# Patient Record
Sex: Female | Born: 1945 | Race: White | Hispanic: No | Marital: Married | Smoking: Never smoker
Health system: Southern US, Community
[De-identification: ages and names within clinical notes are randomized; demographics above are authoritative.]

## PROBLEM LIST (undated history)

## (undated) DIAGNOSIS — F329 Major depressive disorder, single episode, unspecified: Secondary | ICD-10-CM

## (undated) DIAGNOSIS — F0390 Unspecified dementia without behavioral disturbance: Secondary | ICD-10-CM

## (undated) DIAGNOSIS — E785 Hyperlipidemia, unspecified: Secondary | ICD-10-CM

## (undated) DIAGNOSIS — F32A Depression, unspecified: Secondary | ICD-10-CM

## (undated) DIAGNOSIS — K219 Gastro-esophageal reflux disease without esophagitis: Secondary | ICD-10-CM

---

## 2001-03-18 ENCOUNTER — Ambulatory Visit (HOSPITAL_COMMUNITY): Admission: RE | Admit: 2001-03-18 | Discharge: 2001-03-18 | Payer: Self-pay | Admitting: Obstetrics and Gynecology

## 2001-03-18 ENCOUNTER — Encounter: Payer: Self-pay | Admitting: Obstetrics and Gynecology

## 2002-04-06 ENCOUNTER — Encounter: Payer: Self-pay | Admitting: Obstetrics and Gynecology

## 2002-04-06 ENCOUNTER — Ambulatory Visit (HOSPITAL_COMMUNITY): Admission: RE | Admit: 2002-04-06 | Discharge: 2002-04-06 | Payer: Self-pay | Admitting: Obstetrics and Gynecology

## 2003-04-13 ENCOUNTER — Encounter: Payer: Self-pay | Admitting: Obstetrics and Gynecology

## 2003-04-13 ENCOUNTER — Ambulatory Visit (HOSPITAL_COMMUNITY): Admission: RE | Admit: 2003-04-13 | Discharge: 2003-04-13 | Payer: Self-pay | Admitting: Obstetrics and Gynecology

## 2003-09-02 ENCOUNTER — Encounter: Payer: Self-pay | Admitting: Obstetrics and Gynecology

## 2003-09-02 ENCOUNTER — Ambulatory Visit (HOSPITAL_COMMUNITY): Admission: RE | Admit: 2003-09-02 | Discharge: 2003-09-02 | Payer: Self-pay | Admitting: Obstetrics and Gynecology

## 2004-03-12 ENCOUNTER — Ambulatory Visit (HOSPITAL_COMMUNITY): Admission: RE | Admit: 2004-03-12 | Discharge: 2004-03-12 | Payer: Self-pay | Admitting: Internal Medicine

## 2004-05-03 ENCOUNTER — Ambulatory Visit (HOSPITAL_COMMUNITY): Admission: RE | Admit: 2004-05-03 | Discharge: 2004-05-03 | Payer: Self-pay | Admitting: Internal Medicine

## 2005-05-20 ENCOUNTER — Ambulatory Visit (HOSPITAL_COMMUNITY): Admission: RE | Admit: 2005-05-20 | Discharge: 2005-05-20 | Payer: Self-pay | Admitting: Obstetrics and Gynecology

## 2006-05-23 ENCOUNTER — Ambulatory Visit (HOSPITAL_COMMUNITY): Admission: RE | Admit: 2006-05-23 | Discharge: 2006-05-23 | Payer: Self-pay | Admitting: Obstetrics and Gynecology

## 2006-05-29 ENCOUNTER — Ambulatory Visit (HOSPITAL_COMMUNITY): Admission: RE | Admit: 2006-05-29 | Discharge: 2006-05-29 | Payer: Self-pay | Admitting: Internal Medicine

## 2007-05-26 ENCOUNTER — Ambulatory Visit (HOSPITAL_COMMUNITY): Admission: RE | Admit: 2007-05-26 | Discharge: 2007-05-26 | Payer: Self-pay | Admitting: Obstetrics and Gynecology

## 2008-08-16 ENCOUNTER — Ambulatory Visit (HOSPITAL_COMMUNITY): Admission: RE | Admit: 2008-08-16 | Discharge: 2008-08-16 | Payer: Self-pay | Admitting: Obstetrics and Gynecology

## 2009-04-11 ENCOUNTER — Ambulatory Visit (HOSPITAL_COMMUNITY): Admission: RE | Admit: 2009-04-11 | Discharge: 2009-04-11 | Payer: Self-pay | Admitting: Internal Medicine

## 2009-09-05 ENCOUNTER — Ambulatory Visit (HOSPITAL_COMMUNITY): Admission: RE | Admit: 2009-09-05 | Discharge: 2009-09-05 | Payer: Self-pay | Admitting: Obstetrics and Gynecology

## 2010-09-05 ENCOUNTER — Encounter (HOSPITAL_COMMUNITY)
Admission: RE | Admit: 2010-09-05 | Discharge: 2010-10-05 | Payer: Self-pay | Source: Home / Self Care | Admitting: Internal Medicine

## 2010-09-10 ENCOUNTER — Ambulatory Visit (HOSPITAL_COMMUNITY)
Admission: RE | Admit: 2010-09-10 | Discharge: 2010-09-10 | Payer: Self-pay | Source: Home / Self Care | Admitting: Obstetrics and Gynecology

## 2011-01-21 ENCOUNTER — Emergency Department (HOSPITAL_COMMUNITY): Payer: Medicare Other

## 2011-01-21 ENCOUNTER — Observation Stay (HOSPITAL_COMMUNITY)
Admission: EM | Admit: 2011-01-21 | Discharge: 2011-01-22 | Disposition: A | Discharge status: Home / Self Care | Payer: Medicare Other | Attending: Emergency Medicine | Admitting: Emergency Medicine

## 2011-01-21 DIAGNOSIS — G459 Transient cerebral ischemic attack, unspecified: Principal | ICD-10-CM | POA: Insufficient documentation

## 2011-01-21 DIAGNOSIS — Z7982 Long term (current) use of aspirin: Secondary | ICD-10-CM | POA: Insufficient documentation

## 2011-01-21 DIAGNOSIS — R4789 Other speech disturbances: Secondary | ICD-10-CM | POA: Insufficient documentation

## 2011-01-21 DIAGNOSIS — Z79899 Other long term (current) drug therapy: Secondary | ICD-10-CM | POA: Insufficient documentation

## 2011-01-21 DIAGNOSIS — M069 Rheumatoid arthritis, unspecified: Secondary | ICD-10-CM | POA: Insufficient documentation

## 2011-01-21 LAB — COMPREHENSIVE METABOLIC PANEL
ALT: 17 U/L (ref 0–35)
ALT: 16 U/L (ref 0–35)
AST: 26 U/L (ref 0–37)
Albumin: 3.9 g/dL (ref 3.5–5.2)
Alkaline Phosphatase: 53 U/L (ref 39–117)
BUN: 14 mg/dL (ref 6–23)
CO2: 31 mEq/L (ref 19–32)
Calcium: 9.7 mg/dL (ref 8.4–10.5)
Creatinine, Ser: 0.94 mg/dL (ref 0.4–1.2)
GFR calc Af Amer: >60 mL/min (ref >60)
GFR calc non Af Amer: >60 mL/min (ref >60)
GFR calc non Af Amer: 60 mL/min — ABNORMAL LOW (ref >60)
Glucose, Bld: 100 mg/dL — ABNORMAL HIGH (ref 70–99)
Glucose, Bld: 135 mg/dL — ABNORMAL HIGH (ref 70–99)
Potassium: 4.3 mEq/L (ref 3.5–5.1)
Potassium: 4 mEq/L (ref 3.5–5.1)
Sodium: 137 mEq/L (ref 135–145)
Total Bilirubin: 0.7 mg/dL (ref 0.3–1.2)
Total Bilirubin: 0.7 mg/dL (ref 0.3–1.2)

## 2011-01-21 LAB — URINALYSIS, ROUTINE W REFLEX MICROSCOPIC
Ketones, ur: NEGATIVE mg/dL
Nitrite: NEGATIVE
Specific Gravity, Urine: 1.007 (ref 1.005–1.030)

## 2011-01-21 LAB — CBC
MCHC: 34 g/dL (ref 30.0–36.0)
MCV: 96.2 fL (ref 78.0–100.0)
Platelets: 186 10*3/uL (ref 150–400)
RDW: 13.2 % (ref 11.5–15.5)

## 2011-01-21 LAB — DIFFERENTIAL
Basophils Relative: 1 % (ref 0–1)
Eosinophils Absolute: 0.2 10*3/uL (ref 0.0–0.7)
Eosinophils Relative: 3 % (ref 0–5)
Lymphs Abs: 2.5 10*3/uL (ref 0.7–4.0)
Monocytes Absolute: 0.8 10*3/uL (ref 0.1–1.0)
Neutro Abs: 3.1 10*3/uL (ref 1.7–7.7)

## 2011-01-21 LAB — CK TOTAL AND CKMB (NOT AT ARMC): Total CK: 104 U/L (ref 7–177)

## 2011-01-21 LAB — PROTIME-INR
INR: 0.92 (ref 0.00–1.49)
Prothrombin Time: 12.6 seconds (ref 11.6–15.2)

## 2011-01-22 ENCOUNTER — Observation Stay (HOSPITAL_COMMUNITY): Payer: Medicare Other

## 2011-01-22 DIAGNOSIS — G459 Transient cerebral ischemic attack, unspecified: Secondary | ICD-10-CM

## 2011-01-22 LAB — LIPID PANEL
Cholesterol: 152 mg/dL (ref 0–200)
HDL: 39 mg/dL — ABNORMAL LOW (ref >39)
LDL Cholesterol: 100 mg/dL — ABNORMAL HIGH (ref 0–99)
Triglycerides: 67 mg/dL (ref <150)

## 2011-01-22 LAB — HEMOGLOBIN A1C
Hgb A1c MFr Bld: 5.8 % — ABNORMAL HIGH (ref <5.7)
Mean Plasma Glucose: 120 mg/dL — ABNORMAL HIGH (ref <117)

## 2011-01-22 LAB — TROPONIN I: Troponin I: 0.01 ng/mL (ref 0.00–0.06)

## 2011-04-05 NOTE — Procedures (Signed)
NAMEJENIFFER, CULLIVER              ACCOUNT NO.:  [id number]   MEDICAL RECORD NO.:  [id number]           PATIENT TYPE:  REC   LOCATION:                                FACILITY:  [facility]   PHYSICIAN:  Kingsley Callander. Ouida Sills, MD       DATE OF BIRTH:  05/16/1946   DATE OF PROCEDURE:  05/29/2006  DATE OF DISCHARGE:                                    STRESS TEST   SUBJECTIVE:  Patient underwent a Myoview stress test for evaluation of a  recent episode of chest pain.  She exercised 7 minutes and 25 seconds (one  minute and 25 seconds into stage III of the Bruce protocol).  Obtained a  maximal heart rate of 148 (93% of the age predicted maximal heart rate), had  a work load of 10.1 METS, and discontinued exercise due to fatigue.  There  were no symptoms of chest pain.  There were no arrhythmias.  There were no  ST-segment changes diagnostic of ischemia.  Baseline electrocardiogram  revealed a normal sinus rhythm at 70 beats per minute with poor R-wave  progression.   IMPRESSION:  No evidence of exercise-induced ischemia.  Myoview images  pending.      Kingsley Callander. Ouida Sills, MD  Electronically Signed     ROF/MEDQ  D:  05/29/2006  T:  05/29/2006  Job:  [id number]

## 2011-08-20 ENCOUNTER — Other Ambulatory Visit (HOSPITAL_COMMUNITY): Payer: Self-pay | Admitting: Obstetrics and Gynecology

## 2011-08-20 DIAGNOSIS — Z139 Encounter for screening, unspecified: Secondary | ICD-10-CM

## 2011-09-16 ENCOUNTER — Ambulatory Visit (HOSPITAL_COMMUNITY)
Admission: RE | Admit: 2011-09-16 | Discharge: 2011-09-16 | Disposition: A | Discharge status: Home / Self Care | Payer: Medicare Other | Source: Ambulatory Visit | Attending: Obstetrics and Gynecology | Admitting: Obstetrics and Gynecology

## 2011-09-16 DIAGNOSIS — Z139 Encounter for screening, unspecified: Secondary | ICD-10-CM

## 2011-09-16 DIAGNOSIS — Z1231 Encounter for screening mammogram for malignant neoplasm of breast: Secondary | ICD-10-CM | POA: Insufficient documentation

## 2011-09-17 ENCOUNTER — Other Ambulatory Visit (HOSPITAL_COMMUNITY): Payer: Self-pay | Admitting: Internal Medicine

## 2011-09-17 ENCOUNTER — Ambulatory Visit (HOSPITAL_COMMUNITY)
Admission: RE | Admit: 2011-09-17 | Discharge: 2011-09-17 | Disposition: A | Discharge status: Home / Self Care | Payer: Medicare Other | Source: Ambulatory Visit | Attending: Internal Medicine | Admitting: Internal Medicine

## 2011-09-17 DIAGNOSIS — R059 Cough, unspecified: Secondary | ICD-10-CM | POA: Insufficient documentation

## 2011-09-17 DIAGNOSIS — R05 Cough: Secondary | ICD-10-CM | POA: Insufficient documentation

## 2012-08-24 ENCOUNTER — Other Ambulatory Visit (HOSPITAL_COMMUNITY): Payer: Self-pay | Admitting: Internal Medicine

## 2012-08-24 DIAGNOSIS — N6459 Other signs and symptoms in breast: Secondary | ICD-10-CM

## 2012-08-24 DIAGNOSIS — Z139 Encounter for screening, unspecified: Secondary | ICD-10-CM

## 2012-09-17 ENCOUNTER — Ambulatory Visit (HOSPITAL_COMMUNITY)
Admission: RE | Admit: 2012-09-17 | Discharge: 2012-09-17 | Disposition: A | Discharge status: Home / Self Care | Payer: Medicare Other | Source: Ambulatory Visit | Attending: Internal Medicine | Admitting: Internal Medicine

## 2012-09-17 DIAGNOSIS — Z139 Encounter for screening, unspecified: Secondary | ICD-10-CM

## 2012-09-23 ENCOUNTER — Other Ambulatory Visit (HOSPITAL_COMMUNITY): Payer: Self-pay | Admitting: Internal Medicine

## 2012-09-23 ENCOUNTER — Ambulatory Visit (HOSPITAL_COMMUNITY)
Admission: RE | Admit: 2012-09-23 | Discharge: 2012-09-23 | Disposition: A | Discharge status: Home / Self Care | Payer: Medicare Other | Source: Ambulatory Visit | Attending: Internal Medicine | Admitting: Internal Medicine

## 2012-09-23 DIAGNOSIS — N6459 Other signs and symptoms in breast: Secondary | ICD-10-CM

## 2012-09-23 DIAGNOSIS — N63 Unspecified lump in unspecified breast: Secondary | ICD-10-CM | POA: Insufficient documentation

## 2013-09-06 ENCOUNTER — Other Ambulatory Visit (HOSPITAL_COMMUNITY): Payer: Self-pay | Admitting: Internal Medicine

## 2013-09-06 DIAGNOSIS — Z139 Encounter for screening, unspecified: Secondary | ICD-10-CM

## 2013-09-24 ENCOUNTER — Ambulatory Visit (HOSPITAL_COMMUNITY): Payer: Medicare Other

## 2013-10-11 ENCOUNTER — Ambulatory Visit (HOSPITAL_COMMUNITY)
Admission: RE | Admit: 2013-10-11 | Discharge: 2013-10-11 | Disposition: A | Discharge status: Home / Self Care | Payer: Medicare Other | Source: Ambulatory Visit | Attending: Internal Medicine | Admitting: Internal Medicine

## 2013-10-11 DIAGNOSIS — Z139 Encounter for screening, unspecified: Secondary | ICD-10-CM

## 2013-10-11 DIAGNOSIS — Z1231 Encounter for screening mammogram for malignant neoplasm of breast: Secondary | ICD-10-CM | POA: Insufficient documentation

## 2013-10-18 ENCOUNTER — Other Ambulatory Visit: Payer: Self-pay | Admitting: Internal Medicine

## 2013-10-18 DIAGNOSIS — R928 Other abnormal and inconclusive findings on diagnostic imaging of breast: Secondary | ICD-10-CM

## 2013-11-03 ENCOUNTER — Other Ambulatory Visit: Payer: Self-pay | Admitting: Internal Medicine

## 2013-11-03 ENCOUNTER — Ambulatory Visit (HOSPITAL_COMMUNITY)
Admission: RE | Admit: 2013-11-03 | Discharge: 2013-11-03 | Disposition: A | Discharge status: Home / Self Care | Payer: Medicare Other | Source: Ambulatory Visit | Attending: Internal Medicine | Admitting: Internal Medicine

## 2013-11-03 DIAGNOSIS — R928 Other abnormal and inconclusive findings on diagnostic imaging of breast: Secondary | ICD-10-CM | POA: Insufficient documentation

## 2015-03-18 ENCOUNTER — Emergency Department (HOSPITAL_COMMUNITY)
Admission: EM | Admit: 2015-03-18 | Discharge: 2015-03-18 | Disposition: A | Discharge status: Home / Self Care | Payer: Medicare Other | Attending: Emergency Medicine | Admitting: Emergency Medicine

## 2015-03-18 ENCOUNTER — Encounter (HOSPITAL_COMMUNITY): Payer: Self-pay

## 2015-03-18 DIAGNOSIS — Z79899 Other long term (current) drug therapy: Secondary | ICD-10-CM | POA: Insufficient documentation

## 2015-03-18 DIAGNOSIS — E785 Hyperlipidemia, unspecified: Secondary | ICD-10-CM | POA: Diagnosis not present

## 2015-03-18 DIAGNOSIS — R55 Syncope and collapse: Secondary | ICD-10-CM

## 2015-03-18 DIAGNOSIS — F0390 Unspecified dementia without behavioral disturbance: Secondary | ICD-10-CM | POA: Diagnosis not present

## 2015-03-18 DIAGNOSIS — Z7982 Long term (current) use of aspirin: Secondary | ICD-10-CM | POA: Insufficient documentation

## 2015-03-18 DIAGNOSIS — R197 Diarrhea, unspecified: Secondary | ICD-10-CM | POA: Diagnosis present

## 2015-03-18 DIAGNOSIS — F329 Major depressive disorder, single episode, unspecified: Secondary | ICD-10-CM | POA: Insufficient documentation

## 2015-03-18 DIAGNOSIS — K219 Gastro-esophageal reflux disease without esophagitis: Secondary | ICD-10-CM | POA: Diagnosis not present

## 2015-03-18 HISTORY — DX: Unspecified dementia, unspecified severity, without behavioral disturbance, psychotic disturbance, mood disturbance, and anxiety: F03.90

## 2015-03-18 HISTORY — DX: Depression, unspecified: F32.A

## 2015-03-18 HISTORY — DX: Hyperlipidemia, unspecified: E78.5

## 2015-03-18 HISTORY — DX: Gastro-esophageal reflux disease without esophagitis: K21.9

## 2015-03-18 HISTORY — DX: Major depressive disorder, single episode, unspecified: F32.9

## 2015-03-18 LAB — CBC WITH DIFFERENTIAL/PLATELET
BASOS PCT: 0 % (ref 0–1)
Basophils Absolute: 0 10*3/uL (ref 0.0–0.1)
EOS ABS: 0 10*3/uL (ref 0.0–0.7)
Eosinophils Relative: 0 % (ref 0–5)
HCT: 38.8 % (ref 36.0–46.0)
Hemoglobin: 12.6 g/dL (ref 12.0–15.0)
Lymphocytes Relative: 10 % — ABNORMAL LOW (ref 12–46)
Lymphs Abs: 0.6 10*3/uL — ABNORMAL LOW (ref 0.7–4.0)
MCH: 31.9 pg (ref 26.0–34.0)
MCHC: 32.5 g/dL (ref 30.0–36.0)
MCV: 98.2 fL (ref 78.0–100.0)
MONO ABS: 0.5 10*3/uL (ref 0.1–1.0)
MONOS PCT: 9 % (ref 3–12)
NEUTROS PCT: 81 % — AB (ref 43–77)
Neutro Abs: 4.6 10*3/uL (ref 1.7–7.7)
PLATELETS: 148 10*3/uL — AB (ref 150–400)
RBC: 3.95 MIL/uL (ref 3.87–5.11)
RDW: 12.9 % (ref 11.5–15.5)
WBC: 5.8 10*3/uL (ref 4.0–10.5)

## 2015-03-18 LAB — BASIC METABOLIC PANEL
ANION GAP: 5 (ref 5–15)
BUN: 18 mg/dL (ref 6–23)
CALCIUM: 9.2 mg/dL (ref 8.4–10.5)
CHLORIDE: 106 mmol/L (ref 96–112)
CO2: 27 mmol/L (ref 19–32)
CREATININE: 1.08 mg/dL (ref 0.50–1.10)
GFR calc Af Amer: 59 mL/min — ABNORMAL LOW (ref >90)
GFR calc non Af Amer: 51 mL/min — ABNORMAL LOW (ref >90)
Glucose, Bld: 145 mg/dL — ABNORMAL HIGH (ref 70–99)
Potassium: 4 mmol/L (ref 3.5–5.1)
Sodium: 138 mmol/L (ref 135–145)

## 2015-03-18 LAB — URINALYSIS, ROUTINE W REFLEX MICROSCOPIC
BILIRUBIN URINE: NEGATIVE
GLUCOSE, UA: NEGATIVE mg/dL
Nitrite: NEGATIVE
PH: 5.5 (ref 5.0–8.0)
Protein, ur: NEGATIVE mg/dL
Specific Gravity, Urine: >1.03 — ABNORMAL HIGH (ref 1.005–1.030)
Urobilinogen, UA: 0.2 mg/dL (ref 0.0–1.0)

## 2015-03-18 LAB — URINE MICROSCOPIC-ADD ON

## 2015-03-18 MED ORDER — SODIUM CHLORIDE 0.9 % IV BOLUS (SEPSIS)
1000.0000 mL | Freq: Once | INTRAVENOUS | Status: AC
  Administered 2015-03-18: 1000 mL via INTRAVENOUS

## 2015-03-18 MED ORDER — ONDANSETRON HCL 4 MG/2ML IJ SOLN
4.0000 mg | Freq: Once | INTRAMUSCULAR | Status: AC
  Administered 2015-03-18: 4 mg via INTRAVENOUS
  Filled 2015-03-18: qty 2

## 2015-03-18 MED ORDER — LOPERAMIDE HCL 2 MG PO CAPS: 2.0000 mg | ORAL_CAPSULE | Freq: Four times a day (QID) | ORAL | Status: AC | PRN

## 2015-03-18 NOTE — ED Provider Notes (Signed)
  Physical Exam  BP 99/45 mmHg  Pulse 75  Temp(Src) 98.6 F (37 C) (Oral)  Resp 18  Ht 5' 5.5" (1.664 m)  Wt 124 lb 6.4 oz (56.427 kg)  BMI 20.38 kg/m2  SpO2 98%  Physical Exam  ED Course  Procedures  MDM  Assuming care of the patient this morning. 69 y/o with diarrhea. Had orthostatic syncope yday and came to the ER. Pt is getting ivf. Labs are reassuring. UA is pending. IVF started. If pt is ambulating w/o difficulty, and orthostatics are neg, she can be discharged.  Filed Vitals:   03/18/15 0700  BP: 99/45  Pulse: 75  Temp:   Resp: 18     Alma Muegge, MD 03/18/15 0710

## 2015-03-18 NOTE — ED Provider Notes (Signed)
CSN: [id number]     Arrival date & time 03/18/15  0548 History   First MD Initiated Contact with Patient 03/18/15 0602     Chief Complaint  Patient presents with  . Diarrhea     (Consider location/radiation/quality/duration/timing/severity/associated sxs/prior Treatment) HPI  This is a 69 year old female who has somewhat chronic (which she and her husband temporally relate to her starting Aricept and Lexapro) diarrhea that has been worse over the past 4 days. She had an episode of diarrhea this morning after which she took a shower. After the shower she had another episode of diarrhea and as she was getting up from toilet she became weak and fell against the wall. She did not injure herself when she fell but was transiently too weak to stand or speak. She was noted to be mildly hypotensive on arrival and is now awake and alert to her baseline. She has had nausea but no vomiting. She has had some lower abdominal cramping that is not severe. The diarrhea has been nonbloody. She had another episode of diarrhea in route to the hospital and was cleaned by nursing staff on arrival.  Past Medical History  Diagnosis Date  . Hyperlipidemia   . Depression   . Gastroesophageal reflux   . Dementia    History reviewed. No pertinent past surgical history. No family history on file. History  Substance Use Topics  . Smoking status: Never Smoker   . Smokeless tobacco: Not on file  . Alcohol Use: No   OB History    No data available     Review of Systems  All other systems reviewed and are negative.   Allergies  Review of patient's allergies indicates no known allergies.  Home Medications   Prior to Admission medications   Medication Sig Start Date End Date Taking? Authorizing Provider  atorvastatin (LIPITOR) 20 MG tablet Take 20 mg by mouth daily.   Yes Historical Provider, MD  calcium-vitamin D 250-100 MG-UNIT per tablet Take 1 tablet by mouth 2 (two) times daily.   Yes Historical  Provider, MD  clopidogrel (PLAVIX) 75 MG tablet Take 75 mg by mouth daily.   Yes Historical Provider, MD  donepezil (ARICEPT) 5 MG tablet Take 5 mg by mouth at bedtime.   Yes Historical Provider, MD  escitalopram (LEXAPRO) 10 MG tablet Take 10 mg by mouth daily.   Yes Historical Provider, MD  hydroxychloroquine (PLAQUENIL) 200 MG tablet Take by mouth daily.   Yes Historical Provider, MD  pantoprazole (PROTONIX) 20 MG tablet Take 20 mg by mouth daily.   Yes Historical Provider, MD  Prenatal Vit-Fe Fumarate-FA (PRENATAL MULTIVITAMIN) TABS tablet Take 1 tablet by mouth daily at 12 noon.   Yes Historical Provider, MD  ramipril (ALTACE) 10 MG capsule Take 10 mg by mouth daily.   Yes Historical Provider, MD   BP 97/49 mmHg  Pulse 69  Temp(Src) 98.6 F (37 C) (Oral)  Resp 15  Ht 5' 5.5" (1.664 m)  Wt 124 lb 6.4 oz (56.427 kg)  BMI 20.38 kg/m2  SpO2 98%   Physical Exam  General: Well-developed, well-nourished female in no acute distress; appearance consistent with age of record HENT: normocephalic; atraumatic Eyes: pupils equal, round and reactive to light; extraocular muscles intact; lens implants Neck: supple Heart: regular rate and rhythm Lungs: clear to auscultation bilaterally Abdomen: soft; nondistended; mild suprapubic tenderness; no masses or hepatosplenomegaly; bowel sounds present Extremities: Arthritic changes; surgical changes to feet and toes; pulses normal Neurologic: Awake, alert; motor function  intact in all extremities and symmetric; no facial droop Skin: Warm and dry Psychiatric: Normal mood and affect    ED Course  Procedures (including critical care time)   MDM  Nursing notes and vitals signs, including pulse oximetry, reviewed.  Summary of this visit's results, reviewed by myself:  Labs:  Results for orders placed or performed during the hospital encounter of 03/18/15 (from the past 24 hour(s))  CBC with Differential/Platelet     Status: Abnormal    Collection Time: 03/18/15  6:13 AM  Result Value Ref Range   WBC 5.8 4.0 - 10.5 K/uL   RBC 3.95 3.87 - 5.11 MIL/uL   Hemoglobin 12.6 12.0 - 15.0 g/dL   HCT 70.1 77.9 - 39.0 %   MCV 98.2 78.0 - 100.0 fL   MCH 31.9 26.0 - 34.0 pg   MCHC 32.5 30.0 - 36.0 g/dL   RDW 30.0 92.3 - 30.0 %   Platelets 148 (L) 150 - 400 K/uL   Neutrophils Relative % 81 (H) 43 - 77 %   Neutro Abs 4.6 1.7 - 7.7 K/uL   Lymphocytes Relative 10 (L) 12 - 46 %   Lymphs Abs 0.6 (L) 0.7 - 4.0 K/uL   Monocytes Relative 9 3 - 12 %   Monocytes Absolute 0.5 0.1 - 1.0 K/uL   Eosinophils Relative 0 0 - 5 %   Eosinophils Absolute 0.0 0.0 - 0.7 K/uL   Basophils Relative 0 0 - 1 %   Basophils Absolute 0.0 0.0 - 0.1 K/uL  Basic metabolic panel     Status: Abnormal   Collection Time: 03/18/15  6:13 AM  Result Value Ref Range   Sodium 138 135 - 145 mmol/L   Potassium 4.0 3.5 - 5.1 mmol/L   Chloride 106 96 - 112 mmol/L   CO2 27 19 - 32 mmol/L   Glucose, Bld 145 (H) 70 - 99 mg/dL   BUN 18 6 - 23 mg/dL   Creatinine, Ser 7.62 0.50 - 1.10 mg/dL   Calcium 9.2 8.4 - 26.3 mg/dL   GFR calc non Af Amer 51 (L) >90 mL/min   GFR calc Af Amer 59 (L) >90 mL/min   Anion gap 5 5 - 15   7:09 AM IV fluid bolus and Zofran given. Will recheck vital signs and obtain a urine specimen. Dr. Rhunette Croft will follow up and make disposition.     Paula Libra, MD 03/18/15 [phone]

## 2015-03-18 NOTE — ED Notes (Signed)
Pt made aware to return if symptoms worsen or if any life threatening symptoms occur.   

## 2015-03-18 NOTE — Discharge Instructions (Signed)
Please take the medicine for diarrhea. Return to the ER if there is any fevers, bloody stools. Hydrate well.    Diarrhea Diarrhea is frequent loose and watery bowel movements. It can cause you to feel weak and dehydrated. Dehydration can cause you to become tired and thirsty, have a dry mouth, and have decreased urination that often is dark yellow. Diarrhea is a sign of another problem, most often an infection that will not last long. In most cases, diarrhea typically lasts 2-3 days. However, it can last longer if it is a sign of something more serious. It is important to treat your diarrhea as directed by your caregiver to lessen or prevent future episodes of diarrhea. CAUSES  Some common causes include:  Gastrointestinal infections caused by viruses, bacteria, or parasites.  Food poisoning or food allergies.  Certain medicines, such as antibiotics, chemotherapy, and laxatives.  Artificial sweeteners and fructose.  Digestive disorders. HOME CARE INSTRUCTIONS  Ensure adequate fluid intake (hydration): Have 1 cup (8 oz) of fluid for each diarrhea episode. Avoid fluids that contain simple sugars or sports drinks, fruit juices, whole milk products, and sodas. Your urine should be clear or pale yellow if you are drinking enough fluids. Hydrate with an oral rehydration solution that you can purchase at pharmacies, retail stores, and online. You can prepare an oral rehydration solution at home by mixing the following ingredients together:   - tsp table salt.   tsp baking soda.   tsp salt substitute containing potassium chloride.  1  tablespoons sugar.  1 L (34 oz) of water.  Certain foods and beverages may increase the speed at which food moves through the gastrointestinal (GI) tract. These foods and beverages should be avoided and include:  Caffeinated and alcoholic beverages.  High-fiber foods, such as raw fruits and vegetables, nuts, seeds, and whole grain breads and  cereals.  Foods and beverages sweetened with sugar alcohols, such as xylitol, sorbitol, and mannitol.  Some foods may be well tolerated and may help thicken stool including:  Starchy foods, such as rice, toast, pasta, low-sugar cereal, oatmeal, grits, baked potatoes, crackers, and bagels.  Bananas.  Applesauce.  Add probiotic-rich foods to help increase healthy bacteria in the GI tract, such as yogurt and fermented milk products.  Wash your hands well after each diarrhea episode.  Only take over-the-counter or prescription medicines as directed by your caregiver.  Take a warm bath to relieve any burning or pain from frequent diarrhea episodes. SEEK IMMEDIATE MEDICAL CARE IF:   You are unable to keep fluids down.  You have persistent vomiting.  You have blood in your stool, or your stools are black and tarry.  You do not urinate in 6-8 hours, or there is only a small amount of very dark urine.  You have abdominal pain that increases or localizes.  You have weakness, dizziness, confusion, or light-headedness.  You have a severe headache.  Your diarrhea gets worse or does not get better.  You have a fever or persistent symptoms for more than 2-3 days.  You have a fever and your symptoms suddenly get worse. MAKE SURE YOU:   Understand these instructions.  Will watch your condition.  Will get help right away if you are not doing well or get worse. Document Released: 10/25/2002 Document Revised: 03/21/2014 Document Reviewed: 07/12/2012 [facility] Patient Information 2015 [employer] [place]. This information is not intended to replace advice given to you by your health care provider. Make sure you discuss any questions you have  with your health care provider. ° °

## 2015-03-18 NOTE — ED Notes (Signed)
Pt has had vomiting and diarrhea since yesterday, states she got up to go to the bathroom this am and fell.  Pt states she felt dizzy after having diarrhea this am

## 2015-03-19 LAB — URINE CULTURE

## 2015-03-20 ENCOUNTER — Telehealth (HOSPITAL_COMMUNITY): Payer: Self-pay

## 2015-03-20 NOTE — Telephone Encounter (Signed)
Post ED Visit - Positive Culture Follow-up  Culture report reviewed by antimicrobial stewardship pharmacist: []  Susan Mendoza, Pharm.D., BCPS [x]  Susan Mendoza, Pharm.D., BCPS []  Susan Mendoza, Pharm.D., BCPS []  Susan Mendoza, [address]Pharm[address]D., BCPS, AAHIVP []  Susan Mendoza, Pharm.D., BCPS, AAHIVP []  Susan Mendoza, [address]Pharm[address] BCPS  Positive Urine culture, 45,000 colonies -> Group B Strep Chart reviewed by Marlon Peliffany Greene PA "No treatment"  Susan RightClark, Susan Mendoza 03/20/2015, 6:49 PM

## 2015-10-16 ENCOUNTER — Emergency Department (HOSPITAL_COMMUNITY): Payer: Medicare Other

## 2015-10-16 ENCOUNTER — Emergency Department (HOSPITAL_COMMUNITY)
Admission: EM | Admit: 2015-10-16 | Discharge: 2015-10-16 | Disposition: A | Discharge status: Home / Self Care | Payer: Medicare Other | Attending: Emergency Medicine | Admitting: Emergency Medicine

## 2015-10-16 ENCOUNTER — Encounter (HOSPITAL_COMMUNITY): Payer: Self-pay

## 2015-10-16 DIAGNOSIS — Y9289 Other specified places as the place of occurrence of the external cause: Secondary | ICD-10-CM | POA: Insufficient documentation

## 2015-10-16 DIAGNOSIS — W01198A Fall on same level from slipping, tripping and stumbling with subsequent striking against other object, initial encounter: Secondary | ICD-10-CM | POA: Diagnosis not present

## 2015-10-16 DIAGNOSIS — Z79899 Other long term (current) drug therapy: Secondary | ICD-10-CM | POA: Diagnosis not present

## 2015-10-16 DIAGNOSIS — R350 Frequency of micturition: Secondary | ICD-10-CM | POA: Diagnosis not present

## 2015-10-16 DIAGNOSIS — Y93E1 Activity, personal bathing and showering: Secondary | ICD-10-CM | POA: Diagnosis not present

## 2015-10-16 DIAGNOSIS — Y998 Other external cause status: Secondary | ICD-10-CM | POA: Diagnosis not present

## 2015-10-16 DIAGNOSIS — R55 Syncope and collapse: Secondary | ICD-10-CM | POA: Insufficient documentation

## 2015-10-16 DIAGNOSIS — Z7902 Long term (current) use of antithrombotics/antiplatelets: Secondary | ICD-10-CM | POA: Insufficient documentation

## 2015-10-16 DIAGNOSIS — K219 Gastro-esophageal reflux disease without esophagitis: Secondary | ICD-10-CM | POA: Diagnosis not present

## 2015-10-16 DIAGNOSIS — F0390 Unspecified dementia without behavioral disturbance: Secondary | ICD-10-CM | POA: Insufficient documentation

## 2015-10-16 DIAGNOSIS — R42 Dizziness and giddiness: Secondary | ICD-10-CM | POA: Insufficient documentation

## 2015-10-16 DIAGNOSIS — S0011XA Contusion of right eyelid and periocular area, initial encounter: Secondary | ICD-10-CM | POA: Insufficient documentation

## 2015-10-16 DIAGNOSIS — E785 Hyperlipidemia, unspecified: Secondary | ICD-10-CM | POA: Insufficient documentation

## 2015-10-16 DIAGNOSIS — F329 Major depressive disorder, single episode, unspecified: Secondary | ICD-10-CM | POA: Diagnosis not present

## 2015-10-16 LAB — CBC WITH DIFFERENTIAL/PLATELET
BASOS ABS: 0 10*3/uL (ref 0.0–0.1)
Basophils Relative: 0 %
EOS PCT: 1 %
Eosinophils Absolute: 0.1 10*3/uL (ref 0.0–0.7)
HCT: 38.5 % (ref 36.0–46.0)
Hemoglobin: 12.7 g/dL (ref 12.0–15.0)
LYMPHS PCT: 20 %
Lymphs Abs: 1.6 10*3/uL (ref 0.7–4.0)
MCH: 32.4 pg (ref 26.0–34.0)
MCHC: 33 g/dL (ref 30.0–36.0)
MCV: 98.2 fL (ref 78.0–100.0)
Monocytes Absolute: 0.6 10*3/uL (ref 0.1–1.0)
Monocytes Relative: 8 %
Neutro Abs: 5.7 10*3/uL (ref 1.7–7.7)
Neutrophils Relative %: 71 %
PLATELETS: 179 10*3/uL (ref 150–400)
RBC: 3.92 MIL/uL (ref 3.87–5.11)
RDW: 12.9 % (ref 11.5–15.5)
WBC: 8 10*3/uL (ref 4.0–10.5)

## 2015-10-16 LAB — COMPREHENSIVE METABOLIC PANEL
ALK PHOS: 60 U/L (ref 38–126)
ALT: 38 U/L (ref 14–54)
AST: 31 U/L (ref 15–41)
Albumin: 3.7 g/dL (ref 3.5–5.0)
Anion gap: 5 (ref 5–15)
BILIRUBIN TOTAL: 0.7 mg/dL (ref 0.3–1.2)
BUN: 15 mg/dL (ref 6–20)
CO2: 29 mmol/L (ref 22–32)
Calcium: 9.3 mg/dL (ref 8.9–10.3)
Chloride: 106 mmol/L (ref 101–111)
Creatinine, Ser: 1.02 mg/dL — ABNORMAL HIGH (ref 0.44–1.00)
GFR calc Af Amer: >60 mL/min (ref >60)
GFR, EST NON AFRICAN AMERICAN: 54 mL/min — AB (ref >60)
GLUCOSE: 97 mg/dL (ref 65–99)
POTASSIUM: 4.2 mmol/L (ref 3.5–5.1)
Sodium: 140 mmol/L (ref 135–145)
TOTAL PROTEIN: 6.9 g/dL (ref 6.5–8.1)

## 2015-10-16 LAB — URINALYSIS, ROUTINE W REFLEX MICROSCOPIC
Bilirubin Urine: NEGATIVE
Glucose, UA: NEGATIVE mg/dL
HGB URINE DIPSTICK: NEGATIVE
KETONES UR: NEGATIVE mg/dL
Leukocytes, UA: NEGATIVE
Nitrite: NEGATIVE
PROTEIN: NEGATIVE mg/dL
Specific Gravity, Urine: >1.03 — ABNORMAL HIGH (ref 1.005–1.030)
pH: 5.5 (ref 5.0–8.0)

## 2015-10-16 LAB — LACTIC ACID, PLASMA
LACTIC ACID, VENOUS: 1.7 mmol/L (ref 0.5–2.0)
Lactic Acid, Venous: 1.2 mmol/L (ref 0.5–2.0)

## 2015-10-16 LAB — TROPONIN I

## 2015-10-16 MED ORDER — SODIUM CHLORIDE 0.9 % IV BOLUS (SEPSIS)
500.0000 mL | Freq: Once | INTRAVENOUS | Status: AC
  Administered 2015-10-16: 500 mL via INTRAVENOUS

## 2015-10-16 NOTE — ED Notes (Signed)
Pt states she was taking a shower and had a near syncopal episode. States her husband was with her and she did not fall. States she had an episode last week and fell. Pt has old bruising around right eye from fall last week

## 2015-10-16 NOTE — Discharge Instructions (Signed)

## 2015-10-16 NOTE — ED Provider Notes (Signed)
CSN: [id number]     Arrival date & time 10/16/15  1028 History  By signing my name below, I, Susan Mendoza, attest that this documentation has been prepared under the direction and in the presence of Benjiman Core, MD. Electronically Signed: Tanda Mendoza, ED Scribe. 10/16/2015. 11:03 AM.  Chief Complaint  Patient presents with  . Near Syncope   LEVEL 5 CAVEAT for dementia  The history is provided by the patient and the spouse. No language interpreter was used.     HPI Comments: Susan Mendoza is a 69 y.o. female with hx dementia who presents to the Emergency Department complaining of near syncopal episode that occurred earlier today. Pt was taking a shower when she felt lightheaded. Pt did not fall or hit her head. Husband reports that pt has had similar episodes twice in the past with actual syncope approximately 1 week ago. Pt was in the shower at that time as well and fell and hit her head, causing bruising to her right eye. Husband mentions that pt also had urinary and bowel incontinence at that time with difficulty speaking. Pt has not been at baseline since episode 1 week ago per husband. Pt has been slower to communicate and has had more frequent urinary and bowel loss. Pt has been eating and drinking normally. Pt denies any other associated symptoms.   Past Medical History  Diagnosis Date  . Hyperlipidemia   . Depression   . Gastroesophageal reflux   . Dementia    History reviewed. No pertinent past surgical history. No family history on file. Social History  Substance Use Topics  . Smoking status: Never Smoker   . Smokeless tobacco: None  . Alcohol Use: No   OB History    No data available     Review of Systems  Unable to perform ROS: Dementia      Allergies  Review of patient's allergies indicates no known allergies.  Home Medications   Prior to Admission medications   Medication Sig Start Date End Date Taking? Authorizing Provider  acetaminophen  (TYLENOL) 650 MG CR tablet Take 650 mg by mouth every 8 (eight) hours as needed for pain.   Yes Historical Provider, MD  atorvastatin (LIPITOR) 20 MG tablet Take 20 mg by mouth daily.   Yes Historical Provider, MD  calcium carbonate (TUMS - DOSED IN MG ELEMENTAL CALCIUM) 500 MG chewable tablet Chew 2 tablets by mouth at bedtime.   Yes Historical Provider, MD  calcium-vitamin D 250-100 MG-UNIT per tablet Take 1 tablet by mouth daily.    Yes Historical Provider, MD  clopidogrel (PLAVIX) 75 MG tablet Take 75 mg by mouth daily.   Yes Historical Provider, MD  diphenhydramine-acetaminophen (TYLENOL PM) 25-500 MG TABS Take 2 tablets by mouth at bedtime.    Yes Historical Provider, MD  donepezil (ARICEPT) 5 MG tablet Take 5 mg by mouth at bedtime.   Yes Historical Provider, MD  escitalopram (LEXAPRO) 10 MG tablet Take 10 mg by mouth daily.   Yes Historical Provider, MD  fluticasone (FLONASE) 50 MCG/ACT nasal spray Place 1 spray into both nostrils daily as needed for allergies or rhinitis.   Yes Historical Provider, MD  hydroxychloroquine (PLAQUENIL) 200 MG tablet Take 200 mg by mouth 2 (two) times daily.    Yes Historical Provider, MD  loperamide (IMODIUM) 2 MG capsule Take 1 capsule (2 mg total) by mouth 4 (four) times daily as needed for diarrhea or loose stools. 03/18/15  Yes Derwood Kaplan, MD  mineral  oil-hydrophilic petrolatum (AQUAPHOR) ointment Apply 1 application topically daily as needed for irritation.   Yes Historical Provider, MD  pantoprazole (PROTONIX) 20 MG tablet Take 20 mg by mouth daily.   Yes Historical Provider, MD  Prenatal Vit-Fe Fumarate-FA (PRENATAL MULTIVITAMIN) TABS tablet Take 1 tablet by mouth at bedtime.    Yes Historical Provider, MD  ramipril (ALTACE) 10 MG capsule Take 10 mg by mouth daily.   Yes Historical Provider, MD  Wheat Dextrin (BENEFIBER DRINK MIX PO) Take 1 scoop by mouth daily as needed (bowels).   Yes Historical Provider, MD   Triage Vitals: BP 103/61 mmHg  Pulse  62  Temp(Src) 98 F (36.7 C) (Oral)  Resp 12  SpO2 100%   Physical Exam  Constitutional: She appears well-developed and well-nourished. No distress.  HENT:  Head: Normocephalic.  Eyes: Conjunctivae and EOM are normal.  Ecchymosis on right periorbital region  Neck: Neck supple. No tracheal deviation present.  Cardiovascular: Normal rate.   Pulmonary/Chest: Effort normal and breath sounds normal. No respiratory distress. She has no wheezes. She has no rhonchi. She has no rales.  Abdominal: Soft. There is no tenderness.  Musculoskeletal: Normal range of motion.  Neurological: She is alert.  Awake and at a baseline level confusion per husband  Skin: Skin is warm and dry.  Psychiatric: She has a normal mood and affect. Her behavior is normal.  Nursing note and vitals reviewed.   ED Course  Procedures (including critical care time)  DIAGNOSTIC STUDIES: Oxygen Saturation is 100% on RA, normal by my interpretation.    COORDINATION OF CARE: 11:02 AM-Discussed treatment plan which includes CXR, CT Head, Troponin, Lactic acid, CMP, UA, and CBC with pt at bedside and pt agreed to plan.   Labs Review Labs Reviewed  COMPREHENSIVE METABOLIC PANEL - Abnormal; Notable for the following:    Creatinine, Ser 1.02 (*)    GFR calc non Af Amer 54 (*)    All other components within normal limits  URINALYSIS, ROUTINE W REFLEX MICROSCOPIC (NOT [facility]) - Abnormal; Notable for the following:    Specific Gravity, Urine >1.030 (*)    All other components within normal limits  LACTIC ACID, PLASMA  LACTIC ACID, PLASMA  CBC WITH DIFFERENTIAL/PLATELET  TROPONIN I    Imaging Review Dg Chest 2 View  10/16/2015  CLINICAL DATA:  Fall last week.  Weakness.  Initial encounter. EXAM: CHEST  2 VIEW COMPARISON:  09/17/2011 FINDINGS: Normal heart size and mediastinal contours. No acute infiltrate or edema. No effusion or pneumothorax. Thoracic scoliosis without acute osseous finding. IMPRESSION: No evidence  of active cardiopulmonary disease. Electronically Signed   By: Marnee SpringJonathon  Watts M.D.   On: 10/16/2015 11:55   Ct Head Wo Contrast  10/16/2015  CLINICAL DATA:  Near syncope, fall 1 week ago, dementia. EXAM: CT HEAD WITHOUT CONTRAST TECHNIQUE: Contiguous axial images were obtained from the base of the skull through the vertex without intravenous contrast. COMPARISON:  MR brain 01/22/2011 and CT head 01/21/2011. FINDINGS: No evidence of an acute infarct, acute hemorrhage, mass lesion or mass effect. New atrophy. New ventricular dilatation, right greater than left, possibly in proportion to the degree of atrophy. Periventricular low attenuation. Visualized portions of the paranasal sinuses and mastoid air cells are clear. IMPRESSION: 1. No acute intracranial abnormality. 2. New atrophy. New ventricular dilatation, possibly in proportion to the degree of atrophy. Difficult to exclude normal pressure hydrocephalus. 3. Mild chronic microvascular white matter ischemic changes. Electronically Signed   By: Juliette AlcideMelinda  Blietz M.D.   On: 10/16/2015 12:26   I have personally reviewed and evaluated these images and lab results as part of my medical decision-making.   EKG Interpretation   Date/Time:  Monday October 16 2015 10:34:29 EST Ventricular Rate:  59 PR Interval:    QRS Duration: 112 QT Interval:  440 QTC Calculation: 436 R Axis:   87 Text Interpretation:  Junctional rhythm vs sinus with artifact Borderline  intraventricular conduction delay RSR' in V1 or V2, right VCD or RVH  Baseline wander in lead(s) II III aVF V3 V4 V5 V6 Confirmed by Rubin Payor   MD, Pami Wool [phone]) on 10/16/2015 10:48:10 AM      MDM   Final diagnoses:  Near syncope   Patient with near syncopal episode. History of same. Maybe autonomic/vagal. Lab work shows some dehydration. Feels better after IV fluids. Will discharge home. Patient will follow with her neurologist. I personally performed the services described in this  documentation, which was scribed in my presence. The recorded information has been reviewed and is accurate.        Benjiman Core, MD 10/16/15 [phone]

## 2016-03-26 ENCOUNTER — Other Ambulatory Visit: Payer: Self-pay | Admitting: Internal Medicine

## 2016-03-26 DIAGNOSIS — M25511 Pain in right shoulder: Secondary | ICD-10-CM

## 2016-03-30 ENCOUNTER — Inpatient Hospital Stay: Admission: RE | Admit: 2016-03-30 | Payer: Medicare Other | Source: Ambulatory Visit

## 2016-04-06 ENCOUNTER — Ambulatory Visit
Admission: RE | Admit: 2016-04-06 | Discharge: 2016-04-06 | Disposition: A | Discharge status: Home / Self Care | Payer: Medicare Other | Source: Ambulatory Visit | Attending: Internal Medicine | Admitting: Internal Medicine

## 2016-04-06 DIAGNOSIS — M25511 Pain in right shoulder: Secondary | ICD-10-CM

## 2016-11-18 DEATH — deceased

## 2017-01-15 IMAGING — MR MR SHOULDER*R* W/O CM
4 of 6 series · 14 of 40 positions shown · non-contrast
Comparison: 10/16/2015

CLINICAL DATA: Pain in the right upper extremity extending from the
shoulder to the elbow for several months with reduced range of
motion. Outside radiograph from [REDACTED] dated
03/21/2016 showed "Large lucent and cystic lesions involving the
humeral head and neck."

EXAM:
MRI OF THE RIGHT SHOULDER WITHOUT CONTRAST
TECHNIQUE: Multiplanar, multisequence MR imaging of the shoulder was performed.
No intravenous contrast was administered.

[Series 6: T2 fat-sat · oblique · right · 3.0mm · 0.44mm/px · 3 of 21 slices shown (1 of 3)]
[im 4/21]
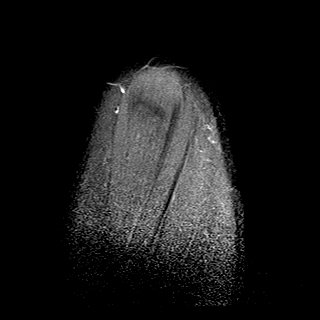
[im 11/21]
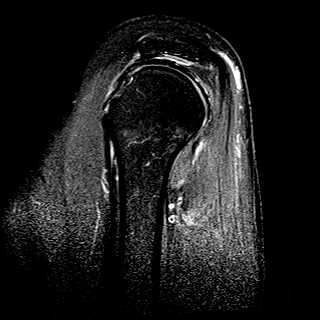
[im 17/21]
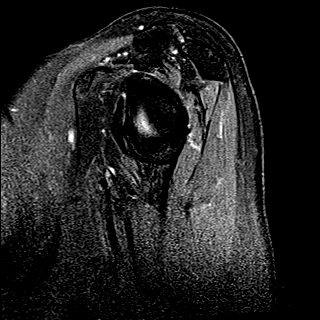

[Series 8: T2 fat-sat · axial · right · 3.0mm · 0.44mm/px · z∈[-75,-9]mm · 3 of 25 slices shown (2 of 3)]
[im 4/25]
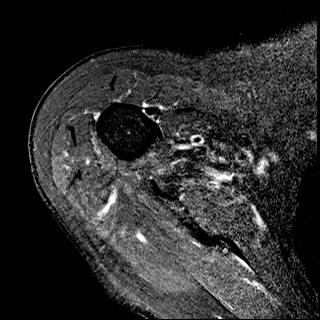
[im 13/25]
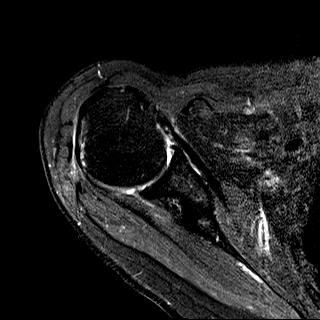
[im 22/25]
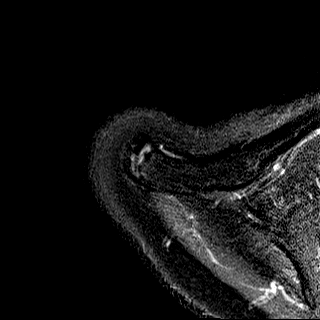

[Series 9: T2 fat-sat · oblique · right · 3.0mm · 0.22mm/px · 3 of 21 slices shown (3 of 3)]
[im 5/21]
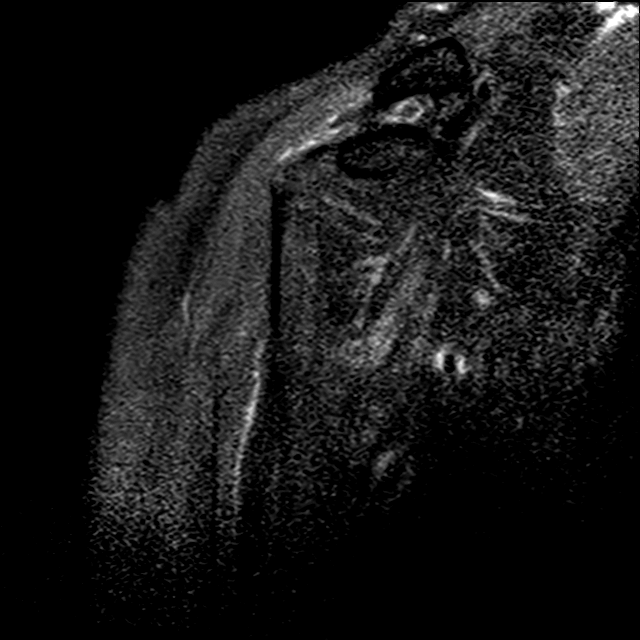
[im 13/21]
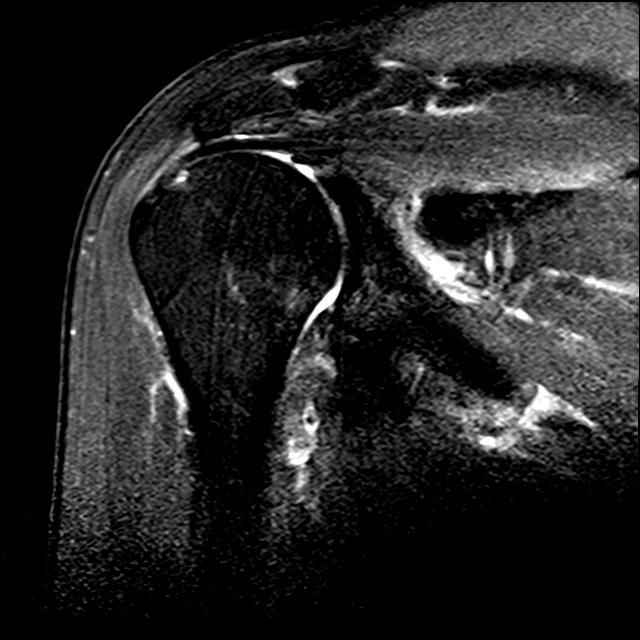
[im 21/21]
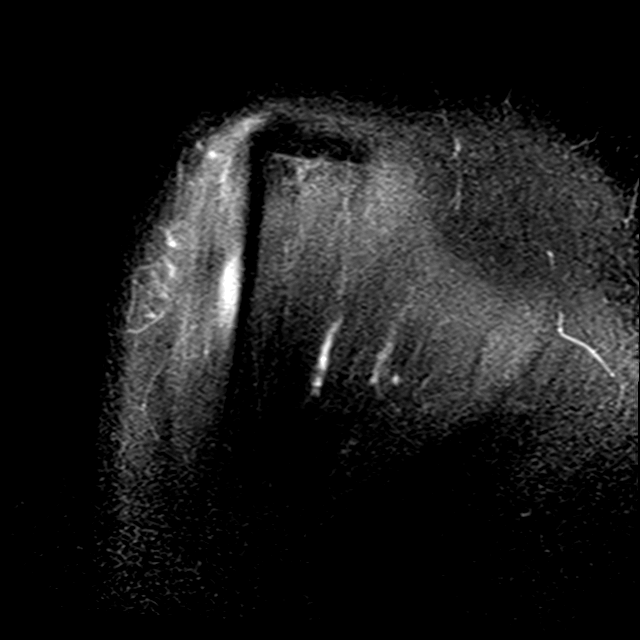

[Series 11: PD · oblique · right · 3.0mm · 0.18mm/px · 5 of 21 slices shown]
[im 1/21]
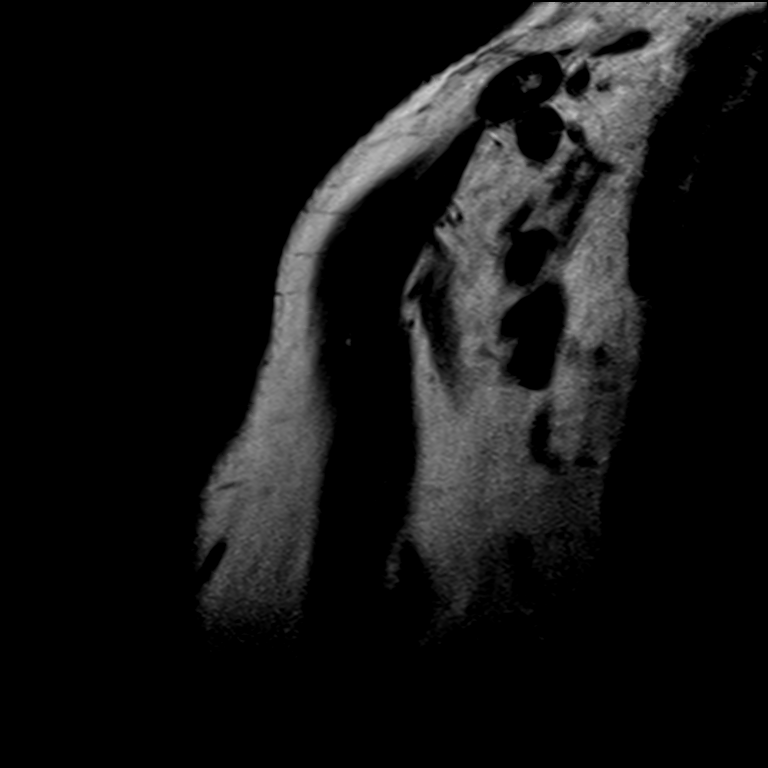
[im 5/21]
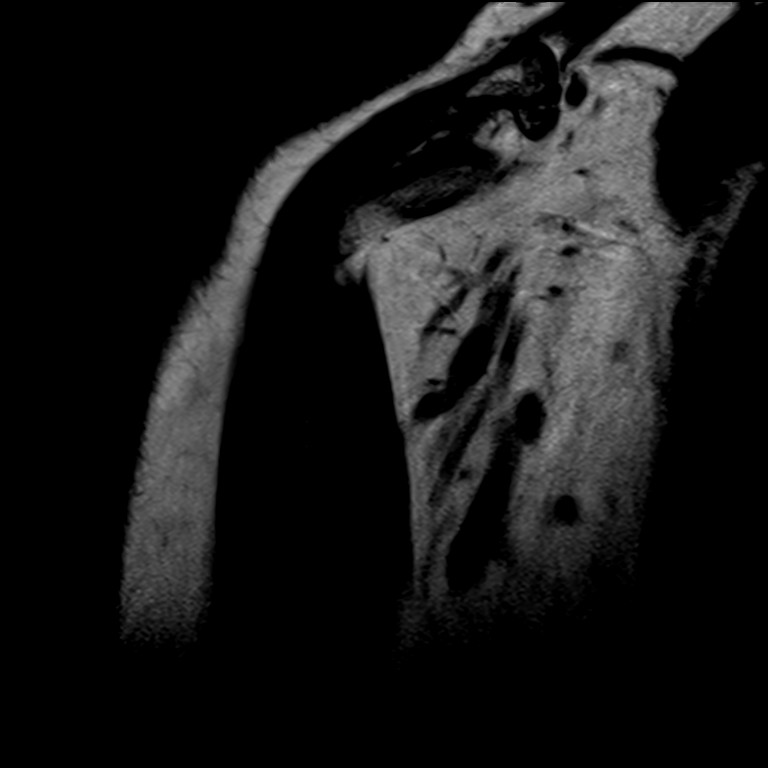
[im 9/21]
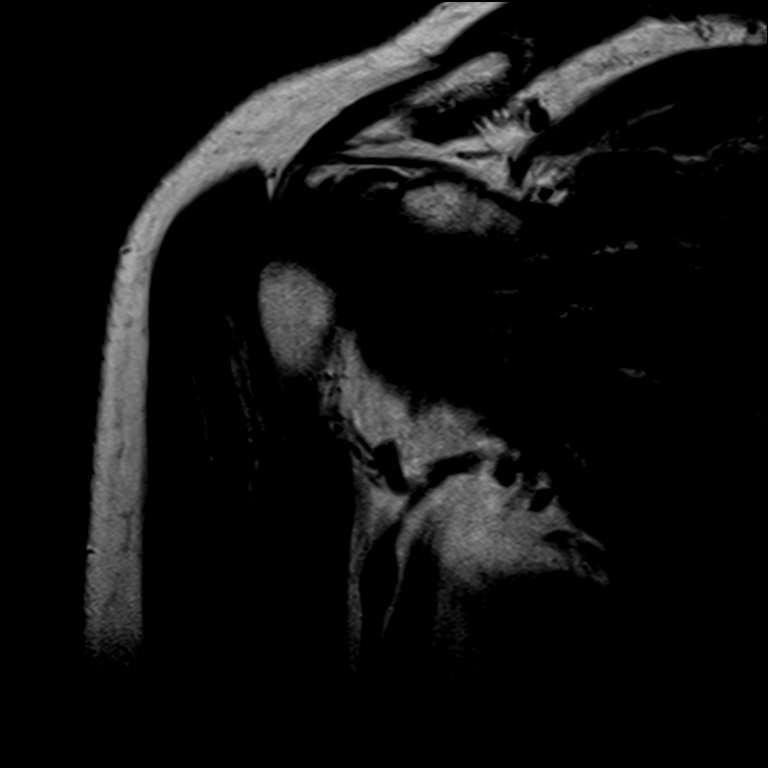
[im 13/21]
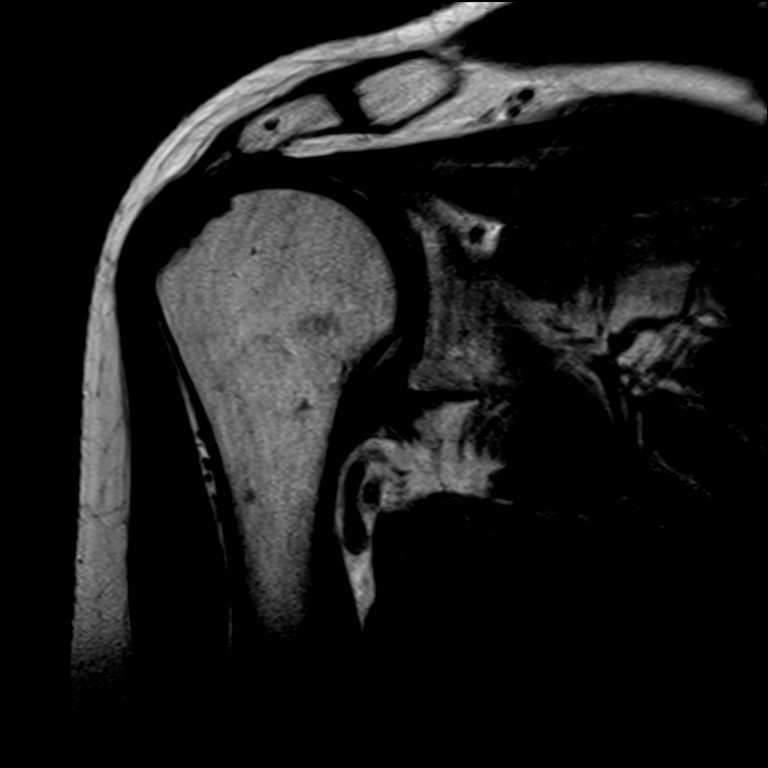
[im 21/21]
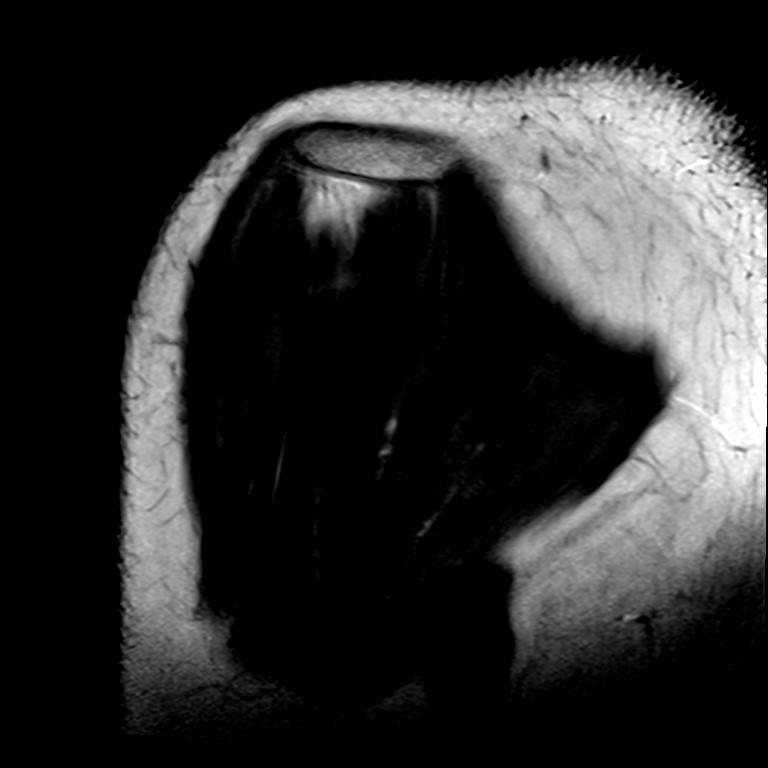

[14 of 40 positions shown; findings below may reference images not displayed]

FINDINGS: Despite efforts by the technologist and patient, motion artifact is
present on today's exam and could not be eliminated. This reduces
exam sensitivity and specificity.

Rotator cuff: Mild supraspinatus and infraspinatus tendinopathy
noted with generalized thinned appearance of the rotator cuff. No
full-thickness rotator cuff tear is identified.

Muscles: Band of edema in the posterior deltoid muscle, shown on
image 16 series 8.

Biceps long head: Longitudinal tearing of the intra-articular
segment.

Acromioclavicular Joint: No significant AC joint arthropathy but
there is lateral downsloping of the acromion which can predispose to
impingement. Subacromial morphology is type 2 (curved).

Glenohumeral Joint: Moderate degenerative chondral thinning. Upper
normal amount of fluid in the joint.

Labrum:  Degenerated superior labrum without a well-defined tear.

Bones: 1.5 by 0.7 cm small focus of probable re- vascularized
avascular necrosis laterally in the humeral head on image [DATE],
with internal marrow signal suggesting healing/revascularization.
Otherwise unremarkable without additional lesions in the humeral
head to explain the radiographic description.
IMPRESSION: 1. Small focus of what appears to be re- vascularized AVN in the
lateral humeral head, but no other humeral head lesions are
identified.
2. Thinning and mild tendinopathy of the rotator cuff without
full-thickness tear.
3. Longitudinal tearing of the intra-articular segment of the long
head of the biceps.
4. Degeneration of the superior labrum without a well-defined labral
tear.
5. Laterally downsloping acromion can predispose to impingement.
6. Moderate degenerative glenohumeral chondral thinning.

## 2022-03-28 ENCOUNTER — Ambulatory Visit: Payer: Self-pay
